# Patient Record
Sex: Male | Born: 1995 | Race: Black or African American | Hispanic: No | Marital: Single | Smoking: Never smoker
Health system: Southern US, Community
[De-identification: ages and names within clinical notes are randomized; demographics above are authoritative.]

## PROBLEM LIST (undated history)

## (undated) DIAGNOSIS — Z21 Asymptomatic human immunodeficiency virus [HIV] infection status: Secondary | ICD-10-CM

## (undated) DIAGNOSIS — B20 Human immunodeficiency virus [HIV] disease: Secondary | ICD-10-CM

---

## 2017-03-12 ENCOUNTER — Ambulatory Visit (HOSPITAL_COMMUNITY)
Admission: EM | Admit: 2017-03-12 | Discharge: 2017-03-12 | Discharge status: Against Medical Advice | Payer: Medicaid Other

## 2017-03-12 ENCOUNTER — Encounter (HOSPITAL_COMMUNITY): Payer: Self-pay | Admitting: Emergency Medicine

## 2017-03-12 DIAGNOSIS — R300 Dysuria: Secondary | ICD-10-CM | POA: Diagnosis not present

## 2017-03-12 HISTORY — DX: Asymptomatic human immunodeficiency virus (hiv) infection status: Z21

## 2017-03-12 HISTORY — DX: Human immunodeficiency virus (HIV) disease: B20

## 2017-03-12 LAB — POCT URINALYSIS DIP (DEVICE)
BILIRUBIN URINE: NEGATIVE
Glucose, UA: NEGATIVE mg/dL
KETONES UR: NEGATIVE mg/dL
LEUKOCYTES UA: NEGATIVE
Nitrite: NEGATIVE
PH: 6.5 (ref 5.0–8.0)
Protein, ur: NEGATIVE mg/dL
Specific Gravity, Urine: 1.015 (ref 1.005–1.030)
Urobilinogen, UA: 0.2 mg/dL (ref 0.0–1.0)

## 2017-03-12 NOTE — ED Notes (Signed)
Dirty and clean urine collected. 

## 2017-03-12 NOTE — ED Triage Notes (Addendum)
The patient presented to the [facility] with a complaint of being exposed to chlamydia. The patient reported a penile discharge and dysuria.

## 2017-03-12 NOTE — ED Notes (Signed)
PT left without being seen by provider

## 2017-10-05 ENCOUNTER — Encounter (HOSPITAL_COMMUNITY): Payer: Self-pay | Admitting: Nurse Practitioner

## 2017-10-05 DIAGNOSIS — M549 Dorsalgia, unspecified: Secondary | ICD-10-CM | POA: Diagnosis present

## 2017-10-05 DIAGNOSIS — Z5321 Procedure and treatment not carried out due to patient leaving prior to being seen by health care provider: Secondary | ICD-10-CM | POA: Insufficient documentation
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
## 2017-10-06 ENCOUNTER — Ambulatory Visit (HOSPITAL_COMMUNITY)
Admission: EM | Admit: 2017-10-06 | Discharge: 2017-10-06 | Disposition: A | Discharge status: Home / Self Care | Payer: Self-pay | Attending: Family Medicine | Admitting: Family Medicine

## 2017-10-06 ENCOUNTER — Encounter (HOSPITAL_COMMUNITY): Payer: Self-pay | Admitting: Family Medicine

## 2017-10-06 ENCOUNTER — Emergency Department (HOSPITAL_COMMUNITY)
Admission: EM | Admit: 2017-10-06 | Discharge: 2017-10-06 | Disposition: A | Discharge status: Home / Self Care | Payer: No Typology Code available for payment source | Attending: Emergency Medicine | Admitting: Emergency Medicine

## 2017-10-06 DIAGNOSIS — M542 Cervicalgia: Secondary | ICD-10-CM

## 2017-10-06 DIAGNOSIS — M7918 Myalgia, other site: Secondary | ICD-10-CM

## 2017-10-06 DIAGNOSIS — M549 Dorsalgia, unspecified: Secondary | ICD-10-CM

## 2017-10-06 MED ORDER — CYCLOBENZAPRINE HCL 10 MG PO TABS: 10.0000 mg | ORAL_TABLET | Freq: Two times a day (BID) | ORAL | 0 refills | Status: AC | PRN
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
## 2017-10-06 NOTE — Discharge Instructions (Signed)
Take flexeril 1 tablet twice a day for 10 days.  You may also take ibuprofen 600 mg every 8 hours.  You can use heating pads and ice for comfort.  Please return if symptoms worsen or have not improved in 1-2 weeks.

## 2017-10-06 NOTE — ED Notes (Signed)
Pt not in lobby. No answer when called/
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
## 2017-10-06 NOTE — ED Notes (Signed)
Pt not in lobby.  

## 2017-10-06 NOTE — ED Notes (Signed)
No answer for room. Not seen in lobby.

## 2018-08-21 ENCOUNTER — Encounter (HOSPITAL_COMMUNITY): Payer: Self-pay

## 2018-08-21 ENCOUNTER — Emergency Department (HOSPITAL_COMMUNITY)
Admission: EM | Admit: 2018-08-21 | Discharge: 2018-08-21 | Disposition: A | Discharge status: Home / Self Care | Payer: Self-pay | Attending: Emergency Medicine | Admitting: Emergency Medicine

## 2018-08-21 DIAGNOSIS — Z113 Encounter for screening for infections with a predominantly sexual mode of transmission: Secondary | ICD-10-CM | POA: Insufficient documentation

## 2018-08-21 DIAGNOSIS — R369 Urethral discharge, unspecified: Secondary | ICD-10-CM | POA: Insufficient documentation

## 2018-08-21 DIAGNOSIS — B20 Human immunodeficiency virus [HIV] disease: Secondary | ICD-10-CM | POA: Insufficient documentation

## 2018-08-21 MED ORDER — CEFTRIAXONE SODIUM 250 MG IJ SOLR
250.0000 mg | Freq: Once | INTRAMUSCULAR | Status: AC
Start: 2018-08-21 — End: 2018-08-21
  Administered 2018-08-21: 250 mg via INTRAMUSCULAR
  Filled 2018-08-21: qty 250

## 2018-08-21 MED ORDER — LEVOFLOXACIN 500 MG PO TABS: 500.0000 mg | ORAL_TABLET | Freq: Every day | ORAL | 0 refills | Status: AC

## 2018-08-21 MED ORDER — LEVOFLOXACIN 500 MG PO TABS
500.0000 mg | ORAL_TABLET | Freq: Once | ORAL | Status: AC
  Administered 2018-08-21: 500 mg via ORAL
  Filled 2018-08-21: qty 1

## 2018-08-21 MED ORDER — STERILE WATER FOR INJECTION IJ SOLN
INTRAMUSCULAR | Status: AC
  Administered 2018-08-21: 10 mL
  Filled 2018-08-21: qty 10

## 2018-08-21 NOTE — ED Triage Notes (Signed)
Pt states that he had a yellowish-white discharge from his penis that he noticed this am, he doesn't complain of any pain Pt last had sex on Sunday and everything was normal

## 2018-08-21 NOTE — Discharge Instructions (Addendum)
Please take antibiotic twice a day for the next 10 days until all pills are completely gone.  You will receive a phone call in the next 2 to 3 days if your results for chlamydia/gonorrhea are positive.  If positive you will need to let all sexual partners know so they can be treated as well.  I have listed information to the health department below.  Come back to the ER immediately if you have any new or concerning symptoms like fever, abdominal pain, vomiting.

## 2018-08-21 NOTE — ED Provider Notes (Signed)
[facility]EM[facility]ENCY [facility]PT Provider Note   CSN: [id number] Arrival date & time: 08/21/18  1958     History   Chief Complaint No chief complaint on file.   HPI Glen Howard is a 22 y.o. male.  HPI   Glen Howard is a 22 year old male who is HIV positive (on daily HAART) who presents to the emergency department for evaluation of penile discharge.  Patient states that he noticed white/yellow penile discharge this morning.  States that he is sexually active with 2 male partners and denies regular condom use.  He is unsure if any of his partners have been exposed to an STD.  He reports occasional bilateral testicular pain, none currently.  No scrotal swelling.  No painful bowel movements, fever, chills, abdominal pain, vomiting, dysuria, urinary frequency, hematuria, flank pain.  Past Medical History:  Diagnosis Date  . HIV (human immunodeficiency virus infection) (HCC)     There are no active problems to display for this patient.   History reviewed. No pertinent surgical history.      Home Medications    Prior to Admission medications   Medication Sig Start Date End Date Taking? Authorizing Provider  abacavir-dolutegravir-lamiVUDine (TRIUMEQ) 600-50-300 MG tablet Take 1 tablet by mouth daily.   Yes [provider]    Family History History reviewed. No pertinent family history.  Social History Social History   Tobacco Use  . Smoking status: Never Smoker  . Smokeless tobacco: Never Used  Substance Use Topics  . Alcohol use: No  . Drug use: Yes    Types: Marijuana     Allergies   Septra [sulfamethoxazole-trimethoprim]   Review of Systems Review of Systems  Constitutional: Negative for chills and fever.  Gastrointestinal: Negative for abdominal pain, diarrhea, nausea and vomiting.  Genitourinary: Positive for discharge and testicular pain. Negative for difficulty urinating, dysuria, genital sores, hematuria, penile pain and  scrotal swelling.  Skin: Negative for rash.     Physical Exam Updated Vital Signs BP 128/71 (BP Location: Right Arm)   Pulse 74   Temp 98.6 F (37 C) (Oral)   Resp 18   SpO2 100%   Physical Exam  Constitutional: He appears well-developed and well-nourished. No distress.  Nontoxic-appearing.  HENT:  Head: Normocephalic and atraumatic.  Eyes: Right eye exhibits no discharge. Left eye exhibits no discharge.  Pulmonary/Chest: Effort normal. No respiratory distress.  Abdominal: Soft. Bowel sounds are normal. There is no tenderness.  Genitourinary:  Genitourinary Comments: Chaperone present for exam. White discharge from penis. No erythema on the penis or testicles. He has mild pain over bilateral epididymis. No testicular masses or swelling. Cremaster reflex present bilaterally.    Neurological: He is alert. Coordination normal.  Skin: Skin is warm and dry. He is not diaphoretic.  Psychiatric: He has a normal mood and affect. His behavior is normal.  Nursing note and vitals reviewed.    ED Treatments / Results  Labs (all labs ordered are listed, but only abnormal results are displayed) Labs Reviewed  GC/CHLAMYDIA PROBE AMP ([place]) NOT AT [facility]    EKG None  Radiology No results found.  Procedures Procedures (including critical care time)  Medications Ordered in ED Medications  levofloxacin (LEVAQUIN) tablet 500 mg (has no administration in time range)  cefTRIAXone (ROCEPHIN) injection 250 mg (has no administration in time range)     Initial Impression / Assessment and Plan / ED Course  I have reviewed the triage vital signs and the nursing notes.  Pertinent  labs & imaging results that were available during my care of the patient were reviewed by me and considered in my medical decision making (see chart for details).     Presents with penile discharge.  Patient is afebrile without abdominal tenderness, abdominal pain or painful bowel movements to indicate  prostatitis.  He has mild tenderness over the bilateral epididymis, no testicular swelling or erythema. Will treat for potential developing epididymitis.No tenderness to palpation of the testes or epididymis. Per CDC recommendation treatment for acute appendicitis likely caused by sexually transmitted chlamydia/gonorrhea and enteric organisms (men who practice insertive anal sex) is IM ceftriaxone in addition to levofloxacin 500 mg once daily x10 days.  Patient received the ceftriaxone in the ED and received his first levofloxacin pill.  Patient understands that he has GC/chlamydia cultures which are pending and will receive a phone call if positive.  If positive, he understands that he will need to inform all sexual partners so that they can be treated as well.  Discussed importance of using protection when sexually active.  Final Clinical Impressions(s) / ED Diagnoses   Final diagnoses:  Penile discharge    ED Discharge Orders         Ordered    levofloxacin (LEVAQUIN) 500 MG tablet  Daily     08/21/18 2054           Kellie Shropshire, PA-C 08/21/18 2054    Terrilee Files, MD 08/22/18 [phone]

## 2018-08-22 LAB — GC/CHLAMYDIA PROBE AMP (~~LOC~~) NOT AT ARMC
Chlamydia: NEGATIVE
NEISSERIA GONORRHEA: POSITIVE — AB

## 2019-04-29 ENCOUNTER — Other Ambulatory Visit: Payer: Self-pay | Admitting: Family Medicine

## 2019-04-29 DIAGNOSIS — N50819 Testicular pain, unspecified: Secondary | ICD-10-CM

## 2019-05-13 ENCOUNTER — Other Ambulatory Visit: Payer: Medicaid Other

## 2019-06-11 ENCOUNTER — Ambulatory Visit
Admission: RE | Admit: 2019-06-11 | Discharge: 2019-06-11 | Disposition: A | Discharge status: Home / Self Care | Payer: BC Managed Care – PPO | Source: Ambulatory Visit | Attending: Family Medicine | Admitting: Family Medicine

## 2019-06-11 ENCOUNTER — Other Ambulatory Visit: Payer: Self-pay

## 2019-06-11 DIAGNOSIS — N50819 Testicular pain, unspecified: Secondary | ICD-10-CM

## 2020-03-04 ENCOUNTER — Ambulatory Visit: Payer: Medicaid Other | Attending: Internal Medicine

## 2020-03-04 DIAGNOSIS — Z23 Encounter for immunization: Secondary | ICD-10-CM

## 2020-03-04 NOTE — Progress Notes (Signed)
   Covid-19 Vaccination Clinic  Name:  Jaquaveon Bilal    MRN: [id number] DOB: [birth year]  03/04/2020  Mr. Ignasiak was observed post Covid-19 immunization for 15 minutes without incident. He was provided with Vaccine Information Sheet and instruction to access the V-Safe system.   Mr. Rothert was instructed to call 911 with any severe reactions post vaccine: Marland Kitchen Difficulty breathing  . Swelling of face and throat  . A fast heartbeat  . A bad rash all over body  . Dizziness and weakness   Immunizations Administered    Name Date Dose VIS Date Route   Pfizer COVID-19 Vaccine 03/04/2020  9:03 AM 0.3 mL 10/31/2019 Intramuscular   Manufacturer: [employer], [employer]   Lot: [id number]   NDC: [address]-8

## 2020-03-29 ENCOUNTER — Ambulatory Visit: Payer: Medicaid Other | Attending: Internal Medicine

## 2020-03-29 ENCOUNTER — Ambulatory Visit: Payer: Medicaid Other

## 2020-03-29 DIAGNOSIS — Z23 Encounter for immunization: Secondary | ICD-10-CM

## 2020-03-29 NOTE — Progress Notes (Signed)
   Covid-19 Vaccination Clinic  Name:  Glen Howard    MRN: [id number] DOB: 07-28-1996  03/29/2020  [name] was observed post Covid-19 immunization for 15 minutes without incident. He was provided with Vaccine Information Sheet and instruction to access the V-Safe system.   [name] was instructed to call 911 with any severe reactions post vaccine: Marland Kitchen Difficulty breathing  . Swelling of face and throat  . A fast heartbeat  . A bad rash all over body  . Dizziness and weakness   Immunizations Administered    Name Date Dose VIS Date Route   Pfizer COVID-19 Vaccine 03/29/2020 12:00 PM 0.3 mL 01/14/2019 Intramuscular   Manufacturer: [employer], [employer]   Lot: [id number]   NDC: [address]-0

## 2020-11-27 IMAGING — US ULTRASOUND SCROTUM DOPPLER COMPLETE
1 series · 14 of 25 positions shown · non-contrast
Comparison: None.

CLINICAL DATA: Right testicular pain for 6 months

EXAM:
SCROTAL ULTRASOUND
DOPPLER ULTRASOUND OF THE TESTICLES
TECHNIQUE: Complete ultrasound examination of the testicles, epididymis, and
other scrotal structures was performed. Color and spectral Doppler
ultrasound were also utilized to evaluate blood flow to the
testicles.

[Series 1: ultrasound scrotum doppler complete · 0.06mm/px · 14 of 51 slices shown]
[im 1/51]
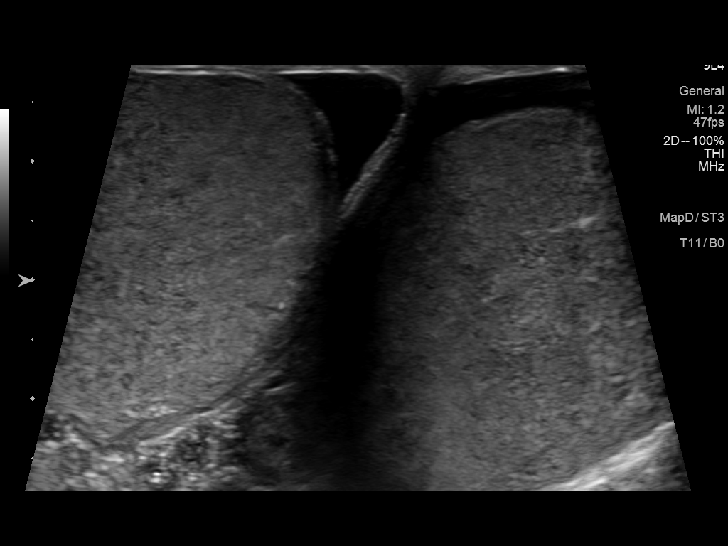
[im 5/51]
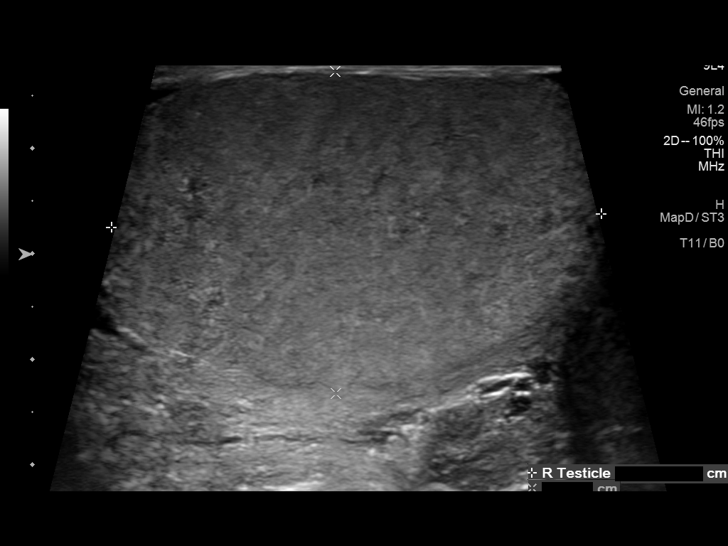
[im 9/51]
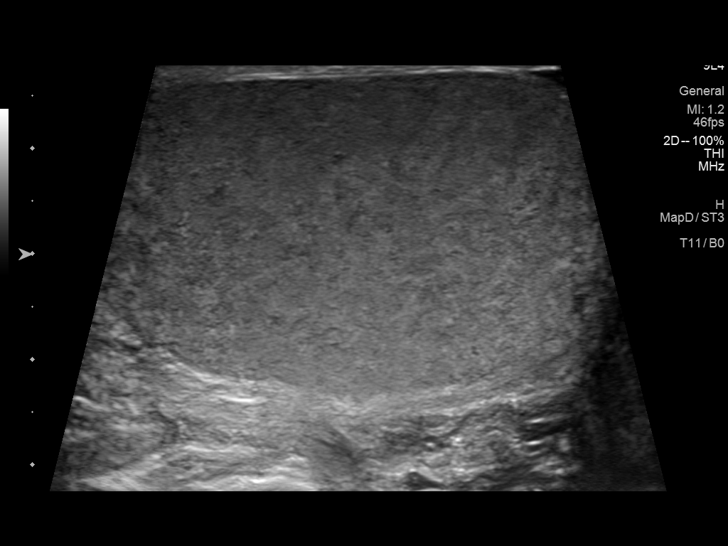
[im 13/51]
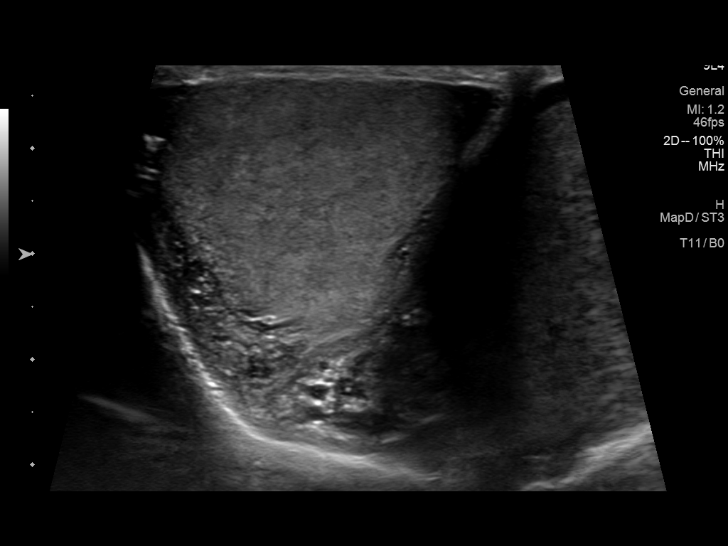
[im 17/51]
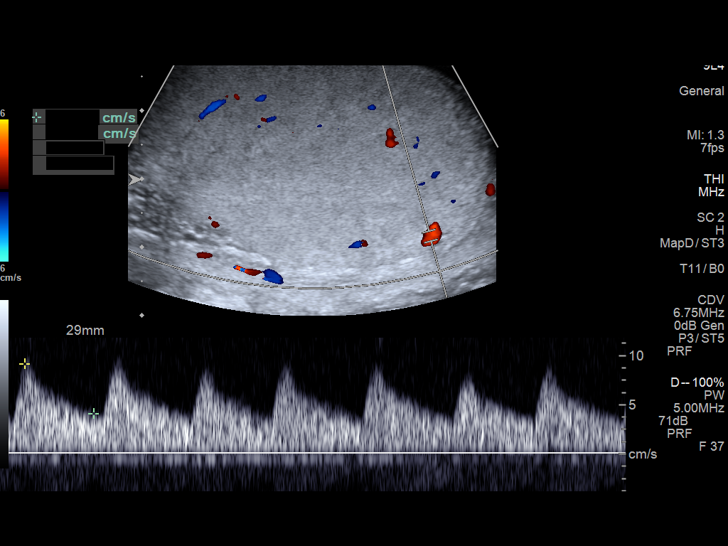
[im 19/51]
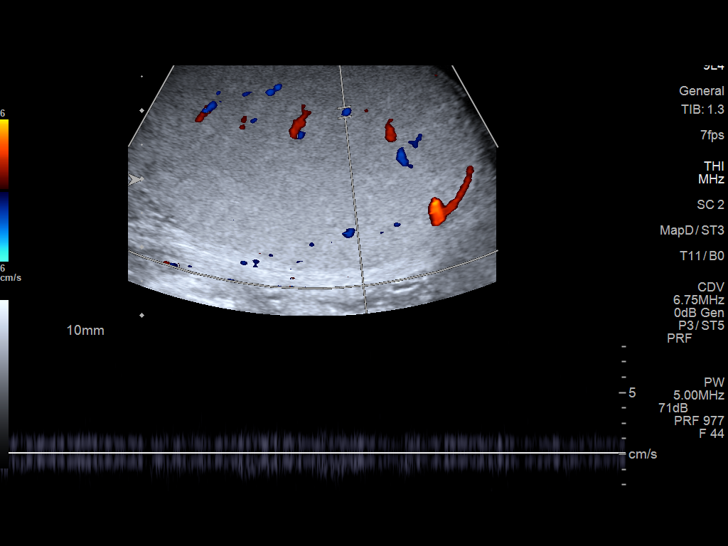
[im 23/51]
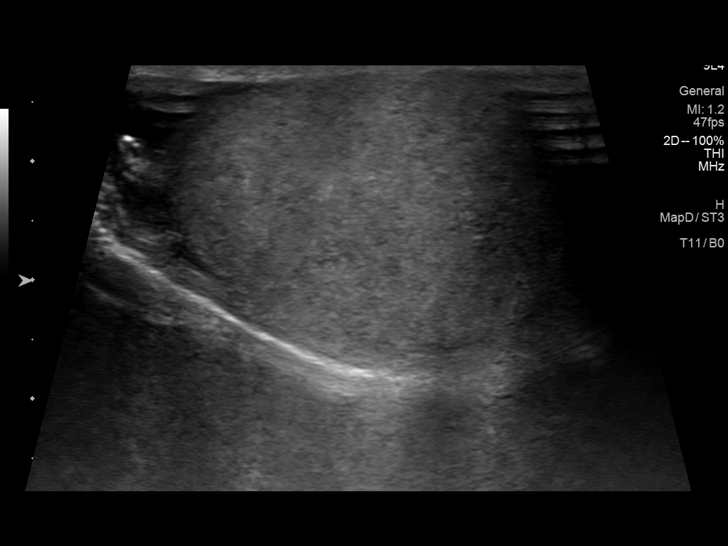
[im 28/51]
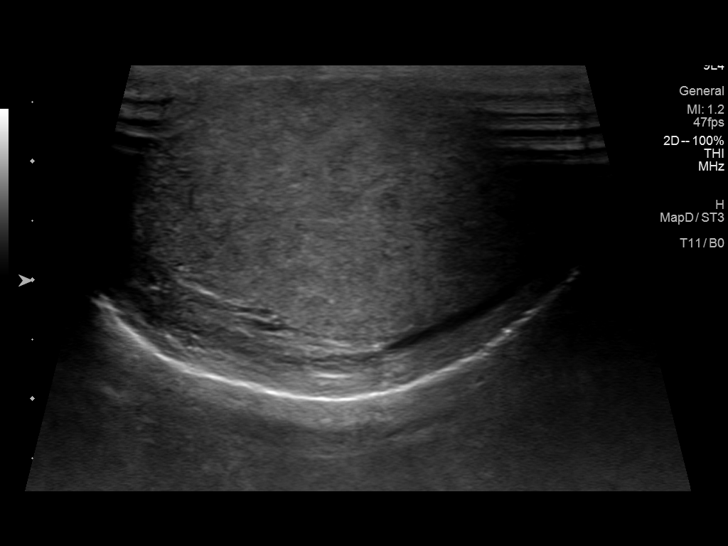
[im 32/51]
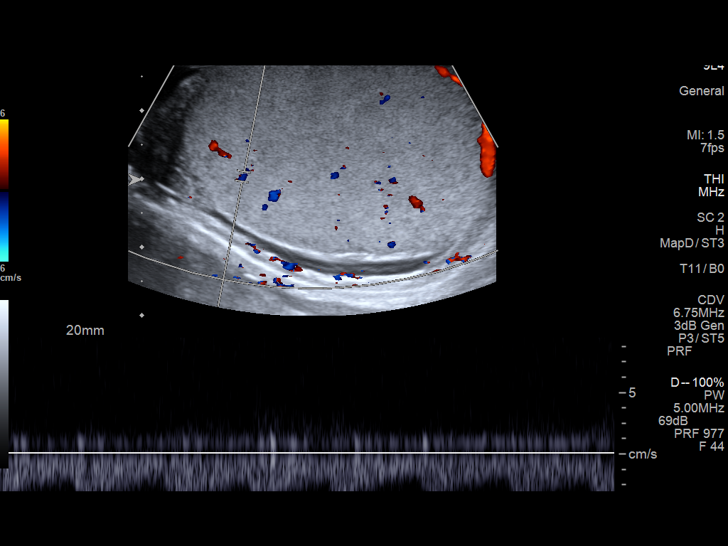
[im 34/51]
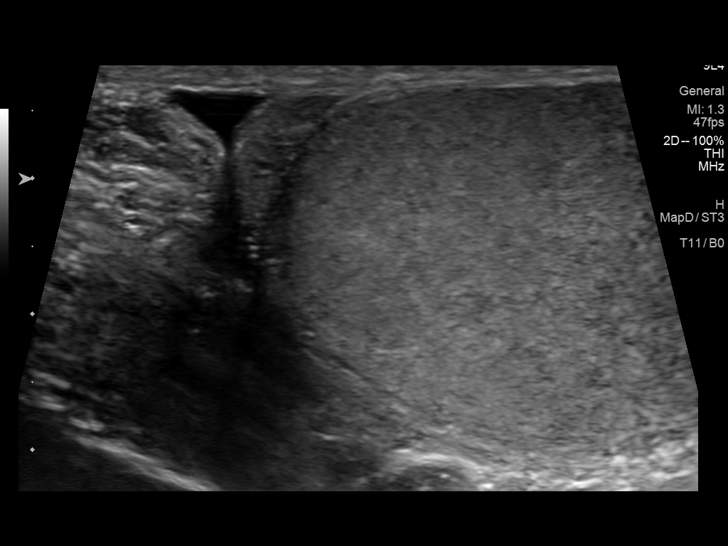
[im 38/51]
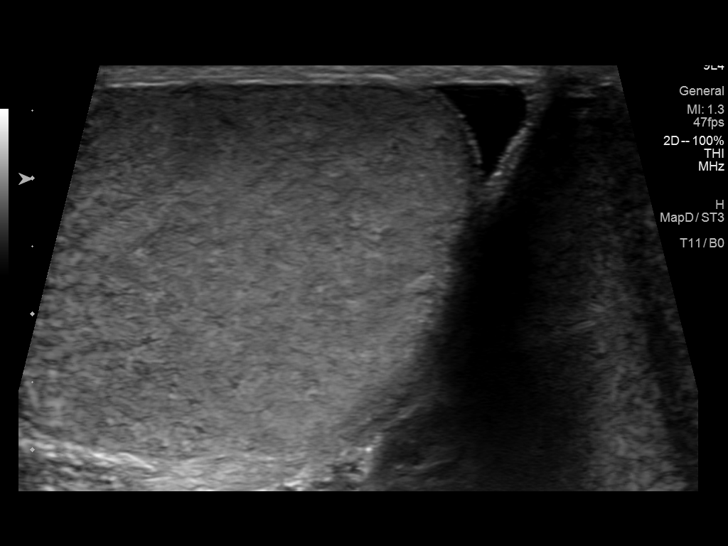
[im 42/51]
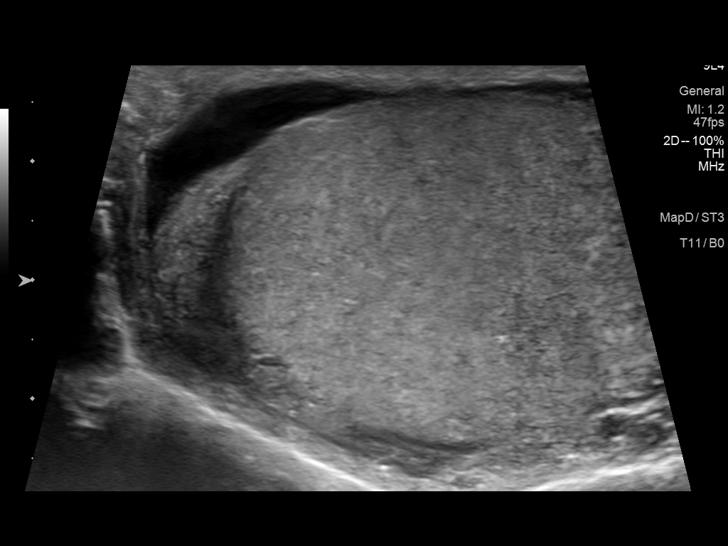
[im 46/51]
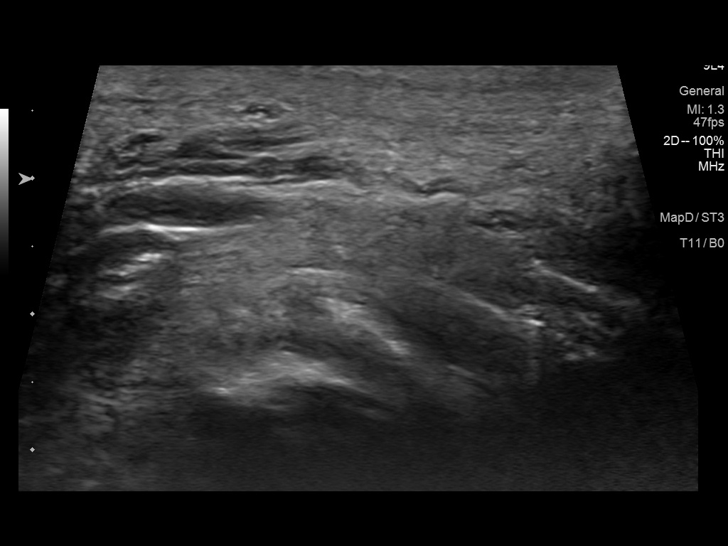
[im 51/51]
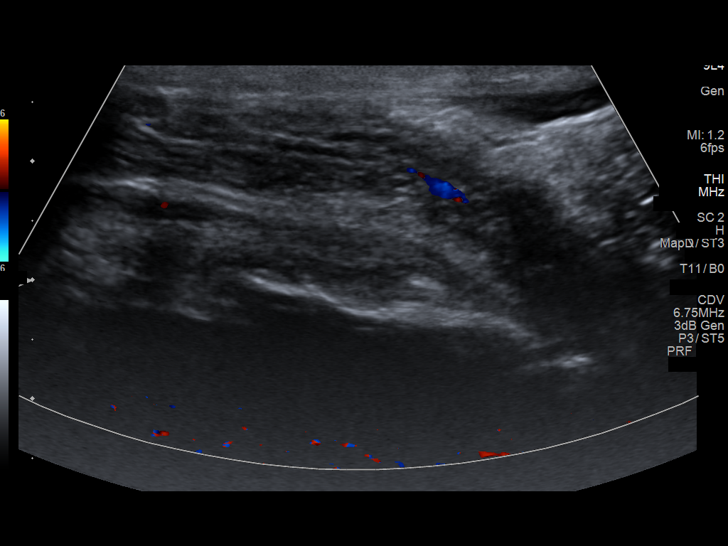

[14 of 25 positions shown; findings below may reference images not displayed]

FINDINGS: Right testicle

Measurements: 4.6 x 3.1 x 4.1 cm. No mass or microlithiasis
visualized.

Left testicle

Measurements: 4.7 x 2.8 x 3.2 cm. No mass or microlithiasis
visualized.

Right epididymis:  Normal in size and appearance.

Left epididymis:  Normal in size and appearance.

Hydrocele:  None visualized.

Varicocele:  None visualized.

Pulsed Doppler interrogation of both testes demonstrates normal low
resistance arterial and venous waveforms bilaterally.
IMPRESSION: No cause for pain identified.  No abnormalities noted.

## 2021-11-18 ENCOUNTER — Other Ambulatory Visit (HOSPITAL_COMMUNITY): Payer: Self-pay

## 2021-11-18 MED ORDER — TRIUMEQ 600-50-300 MG PO TABS
ORAL_TABLET | ORAL | 0 refills | Status: DC
  Filled 2021-11-18: qty 30, 30d supply, fill #0

## 2021-11-23 ENCOUNTER — Other Ambulatory Visit: Payer: Self-pay

## 2021-11-23 ENCOUNTER — Telehealth: Payer: Self-pay | Admitting: Pharmacist

## 2021-11-23 ENCOUNTER — Other Ambulatory Visit (HOSPITAL_COMMUNITY): Payer: Self-pay

## 2021-11-23 ENCOUNTER — Ambulatory Visit: Payer: 59 | Admitting: Pharmacist

## 2021-11-23 DIAGNOSIS — B20 Human immunodeficiency virus [HIV] disease: Secondary | ICD-10-CM

## 2021-11-23 MED ORDER — TRIUMEQ 600-50-300 MG PO TABS
ORAL_TABLET | ORAL | 0 refills | Status: DC
  Filled 2021-11-23: qty 30, 30d supply, fill #0

## 2021-11-23 NOTE — Telephone Encounter (Signed)
Called patient to discuss HIV medication, Triumeq for [employer]. No answer, left HIPAA compliant VM.  Neenah Canter L. Shonette Rhames, PharmD [facility] Clinical Pharmacist Practitioner

## 2021-11-23 NOTE — Progress Notes (Signed)
Virtual Visit via Telephone Note  I connected with Glen Howard on 11/23/21 at  2:30 PM EST by telephone and verified that I am speaking with the correct person using two identifiers.  Location: Patient: Home Provider: Office   I discussed the limitations, risks, security and privacy concerns of performing an evaluation and management service by telephone and the availability of in person appointments. I also discussed with the patient that there may be a patient responsible charge related to this service. The patient expressed understanding and agreed to proceed.  HPI: Glen Howard is a 26 y.o. male who presents for follow-up of their long-term specialty medication, Triumeq.  There are no problems to display for this patient.   Patient's Medications  New Prescriptions   No medications on file  Previous Medications   No medications on file  Modified Medications   Modified Medication Previous Medication   ABACAVIR-DOLUTEGRAVIR-LAMIVUDINE (TRIUMEQ) 600-50-300 MG TABLET abacavir-dolutegravir-lamiVUDine (TRIUMEQ) 600-50-300 MG tablet      Take 1 tablet by mouth daily.    Take 1 tablet by mouth daily.  Discontinued Medications   ABACAVIR-DOLUTEGRAVIR-LAMIVUDINE (TRIUMEQ) 600-50-300 MG TABLET    Take 1 tablet by mouth daily.    Allergies: Allergies  Allergen Reactions   Septra [Sulfamethoxazole-Trimethoprim] Rash    Past Medical History: Past Medical History:  Diagnosis Date   HIV (human immunodeficiency virus infection) (HCC)     Social History: Social History   Socioeconomic History   Marital status: Single    Spouse name: Not on file   Number of children: Not on file   Years of education: Not on file   Highest education level: Not on file  Occupational History   Not on file  Tobacco Use   Smoking status: Never   Smokeless tobacco: Never  Substance and Sexual Activity   Alcohol use: No   Drug use: Yes    Types: Marijuana   Sexual activity: Not on file   Other Topics Concern   Not on file  Social History Narrative   Not on file   Social Determinants of Health   Financial Resource Strain: Not on file  Food Insecurity: Not on file  Transportation Needs: Not on file  Physical Activity: Not on file  Stress: Not on file  Social Connections: Not on file    Labs: No results found for: HIV1RNAQUANT, HIV1RNAVL, CD4TABS  RPR and STI No results found for: LABRPR, RPRTITER  STI Results GC CT  08/21/2018 **POSITIVE**(A) Negative    Hepatitis B No results found for: HEPBSAB, HEPBSAG, HEPBCAB Hepatitis C No results found for: HEPCAB, HCVRNAPCRQN Hepatitis A No results found for: HAV Lipids: No results found for: CHOL, TRIG, HDL, CHOLHDL, VLDL, LDLCALC  Assessment: I spoke with Glen Howard today regarding their specialty medication, Triumeq. Patient takes it every day without any issues or missed doses. No problems with adverse effects or tolerability. No problems getting it from [facility] and will pick up his refill today. Updated/reviewed medication list - no drug interactions. All questions answered. Will follow up in 1 year.  Plan: - Continue Triumeq - Follow up in 1 year   I discussed the assessment and treatment plan with the patient. The patient was provided an opportunity to ask questions and all were answered. The patient agreed with the plan and demonstrated an understanding of the instructions.   The patient was advised to call back or seek an in-person evaluation if the symptoms worsen or if the condition fails to improve as  anticipated.  I provided 5 minutes of non-face-to-face time during this encounter.  Glen Howard L. Glen Howard, PharmD, BCIDP, AAHIVP, CPP Clinical Pharmacist Practitioner Infectious Diseases Clinical Pharmacist [facility] 11/23/2021, 2:47 PM

## 2021-12-08 ENCOUNTER — Other Ambulatory Visit (HOSPITAL_COMMUNITY): Payer: Self-pay

## 2021-12-08 MED ORDER — IBUPROFEN 800 MG PO TABS
800.0000 mg | ORAL_TABLET | Freq: Four times a day (QID) | ORAL | 0 refills | Status: DC | PRN
  Filled 2021-12-08: qty 15, 4d supply, fill #0

## 2021-12-08 MED ORDER — AMOXICILLIN 500 MG PO CAPS
500.0000 mg | ORAL_CAPSULE | Freq: Three times a day (TID) | ORAL | 0 refills | Status: DC
  Filled 2021-12-08: qty 25, 9d supply, fill #0

## 2021-12-20 ENCOUNTER — Other Ambulatory Visit (HOSPITAL_COMMUNITY): Payer: Self-pay

## 2021-12-22 ENCOUNTER — Other Ambulatory Visit (HOSPITAL_COMMUNITY): Payer: Self-pay

## 2021-12-22 ENCOUNTER — Other Ambulatory Visit: Payer: Self-pay | Admitting: Pharmacist

## 2021-12-22 DIAGNOSIS — B20 Human immunodeficiency virus [HIV] disease: Secondary | ICD-10-CM

## 2021-12-22 MED ORDER — TRIUMEQ 600-50-300 MG PO TABS
ORAL_TABLET | ORAL | 5 refills | Status: DC
  Filled 2021-12-22: qty 30, 30d supply, fill #0

## 2021-12-23 ENCOUNTER — Other Ambulatory Visit (HOSPITAL_COMMUNITY): Payer: Self-pay

## 2021-12-26 ENCOUNTER — Other Ambulatory Visit (HOSPITAL_COMMUNITY): Payer: Self-pay

## 2021-12-26 ENCOUNTER — Ambulatory Visit (INDEPENDENT_AMBULATORY_CARE_PROVIDER_SITE_OTHER): Payer: 59 | Admitting: Pharmacist

## 2021-12-26 ENCOUNTER — Other Ambulatory Visit: Payer: Self-pay

## 2021-12-26 DIAGNOSIS — B20 Human immunodeficiency virus [HIV] disease: Secondary | ICD-10-CM

## 2021-12-26 MED ORDER — CABOTEGRAVIR & RILPIVIRINE ER 600 & 900 MG/3ML IM SUER
1.0000 | INTRAMUSCULAR | 5 refills | Status: AC
  Filled 2021-12-26: qty 6, 30d supply, fill #0
  Filled 2021-12-26: qty 6, fill #0
  Filled 2022-01-18: qty 6, 30d supply, fill #1
  Filled 2022-03-15: qty 6, 30d supply, fill #2
  Filled 2022-05-16: qty 6, 30d supply, fill #3
  Filled 2022-07-19: qty 6, 30d supply, fill #4
  Filled 2022-09-19: qty 6, 30d supply, fill #5

## 2021-12-26 MED ORDER — CABOTEGRAVIR & RILPIVIRINE ER 600 & 900 MG/3ML IM SUER
1.0000 | INTRAMUSCULAR | 5 refills | Status: DC
  Filled 2021-12-26: qty 6, 28d supply, fill #0

## 2021-12-26 NOTE — Progress Notes (Signed)
Virtual Visit via Telephone Note  I connected with Glen Howard on 12/26/21 at 11:00 AM by telephone and verified that I am speaking with the correct person using two identifiers.  Location: Patient: Home Provider: Office   I discussed the limitations, risks, security and privacy concerns of performing an evaluation and management service by telephone and the availability of in person appointments. I also discussed with the patient that there may be a patient responsible charge related to this service. The patient expressed understanding and agreed to proceed.  HPI: Glen Howard is a 26 y.o. male who presents for follow-up of their long-term specialty medication, Cabenuva.  There are no problems to display for this patient.   Patient's Medications  New Prescriptions   No medications on file  Previous Medications   ABACAVIR-DOLUTEGRAVIR-LAMIVUDINE (TRIUMEQ) 600-50-300 MG TABLET    Take 1 tablet by mouth daily.   AMOXICILLIN (AMOXIL) 500 MG CAPSULE    Take 1 capsule (500 mg total) by mouth 3 (three) times daily with food until gone.   IBUPROFEN (ADVIL) 800 MG TABLET    Take 1 tablet (800 mg total) by mouth every 6 (six) hours as needed for pain  Modified Medications   Modified Medication Previous Medication   CABOTEGRAVIR & RILPIVIRINE ER (CABENUVA) 600 & 900 MG/3ML INJECTION cabotegravir & rilpivirine ER (CABENUVA) 600 & 900 MG/3ML injection      Inject 66ml into the muscle once monthly for 2 months then every other month thereafter    Inject 57ml into the muscle once monthly for 2 months then every other month thereafter  Discontinued Medications   No medications on file    Allergies: Allergies  Allergen Reactions   Septra [Sulfamethoxazole-Trimethoprim] Rash    Past Medical History: Past Medical History:  Diagnosis Date   HIV (human immunodeficiency virus infection) (HCC)     Social History: Social History   Socioeconomic History   Marital status: Single    Spouse  name: Not on file   Number of children: Not on file   Years of education: Not on file   Highest education level: Not on file  Occupational History   Not on file  Tobacco Use   Smoking status: Never   Smokeless tobacco: Never  Substance and Sexual Activity   Alcohol use: No   Drug use: Yes    Types: Marijuana   Sexual activity: Not on file  Other Topics Concern   Not on file  Social History Narrative   Not on file   Social Determinants of Health   Financial Resource Strain: Not on file  Food Insecurity: Not on file  Transportation Needs: Not on file  Physical Activity: Not on file  Stress: Not on file  Social Connections: Not on file    Labs: No results found for: HIV1RNAQUANT, HIV1RNAVL, CD4TABS  RPR and STI No results found for: LABRPR, RPRTITER  STI Results GC CT  08/21/2018 **POSITIVE**(A) Negative    Hepatitis B No results found for: HEPBSAB, HEPBSAG, HEPBCAB Hepatitis C No results found for: HEPCAB, HCVRNAPCRQN Hepatitis A No results found for: HAV Lipids: No results found for: CHOL, TRIG, HDL, CHOLHDL, VLDL, LDLCALC  Assessment: I spoke with [name] today regarding their new specialty medication, Cabenuva. Currently taking Triumeq without any issues. Counseled that he will need to take his last Triumeq pill the day of his first Cabenuva injection. Counseled on possible side effects of pain and nodule with injection and advised that he can take ibuprofen or tylenol 30-45 minutes  before injections. Updated/reviewed medication list - no drug interactions. All questions answered. Will follow up in 1 year.  Plan: - Take Triumeq until first dose of Cabenuva - Follow up in 1 year   I discussed the assessment and treatment plan with the patient. The patient was provided an opportunity to ask questions and all were answered. The patient agreed with the plan and demonstrated an understanding of the instructions.   The patient was advised to call back or seek an  in-person evaluation if the symptoms worsen or if the condition fails to improve as anticipated.  I provided 15 minutes of non-face-to-face time during this encounter.  Manolito Jurewicz L. Kerington Hildebrant, PharmD, BCIDP, AAHIVP, CPP Clinical Pharmacist Practitioner Infectious Diseases Clinical Pharmacist [facility] 12/26/2021, 12:25 PM

## 2021-12-27 ENCOUNTER — Other Ambulatory Visit (HOSPITAL_COMMUNITY): Payer: Self-pay

## 2021-12-28 ENCOUNTER — Other Ambulatory Visit (HOSPITAL_COMMUNITY): Payer: Self-pay

## 2021-12-29 ENCOUNTER — Other Ambulatory Visit (HOSPITAL_COMMUNITY): Payer: Self-pay

## 2021-12-29 MED ORDER — ACETAMINOPHEN 500 MG PO TABS
500.0000 mg | ORAL_TABLET | Freq: Four times a day (QID) | ORAL | 0 refills | Status: DC | PRN
  Filled 2021-12-29: qty 15, 4d supply, fill #0

## 2021-12-29 MED ORDER — IBUPROFEN 600 MG PO TABS
600.0000 mg | ORAL_TABLET | Freq: Four times a day (QID) | ORAL | 0 refills | Status: AC | PRN
Start: 2021-12-29 — End: ?
  Filled 2021-12-29: qty 15, 4d supply, fill #0

## 2021-12-29 MED ORDER — CHLORHEXIDINE GLUCONATE 0.12 % MT SOLN
15.0000 mL | Freq: Every day | OROMUCOSAL | 0 refills | Status: AC
Start: 2021-12-29 — End: ?
  Filled 2021-12-29: qty 473, 30d supply, fill #0

## 2022-01-04 ENCOUNTER — Other Ambulatory Visit (HOSPITAL_COMMUNITY): Payer: Self-pay

## 2022-01-04 MED ORDER — CABENUVA 600 & 900 MG/3ML IM SUER: INTRAMUSCULAR | 5 refills | Status: AC

## 2022-01-04 MED ORDER — CABENUVA 600 & 900 MG/3ML IM SUER
INTRAMUSCULAR | 5 refills | Status: AC
  Filled 2022-11-16: qty 6, 56d supply, fill #0
  Filled 2022-12-05: qty 6, 56d supply, fill #1

## 2022-01-17 ENCOUNTER — Other Ambulatory Visit (HOSPITAL_COMMUNITY): Payer: Self-pay

## 2022-01-18 ENCOUNTER — Other Ambulatory Visit (HOSPITAL_COMMUNITY): Payer: Self-pay

## 2022-01-24 ENCOUNTER — Other Ambulatory Visit (HOSPITAL_COMMUNITY): Payer: Self-pay

## 2022-02-22 ENCOUNTER — Other Ambulatory Visit (HOSPITAL_COMMUNITY): Payer: Self-pay

## 2022-02-23 ENCOUNTER — Other Ambulatory Visit (HOSPITAL_COMMUNITY): Payer: Self-pay

## 2022-03-15 ENCOUNTER — Other Ambulatory Visit (HOSPITAL_COMMUNITY): Payer: Self-pay

## 2022-03-28 ENCOUNTER — Other Ambulatory Visit (HOSPITAL_COMMUNITY): Payer: Self-pay

## 2022-05-16 ENCOUNTER — Other Ambulatory Visit (HOSPITAL_COMMUNITY): Payer: Self-pay

## 2022-05-29 ENCOUNTER — Other Ambulatory Visit (HOSPITAL_COMMUNITY): Payer: Self-pay

## 2022-06-02 ENCOUNTER — Other Ambulatory Visit (HOSPITAL_COMMUNITY): Payer: Self-pay

## 2022-06-22 ENCOUNTER — Other Ambulatory Visit (HOSPITAL_COMMUNITY): Payer: Self-pay

## 2022-07-19 ENCOUNTER — Other Ambulatory Visit (HOSPITAL_COMMUNITY): Payer: Self-pay

## 2022-07-25 ENCOUNTER — Other Ambulatory Visit (HOSPITAL_COMMUNITY): Payer: Self-pay

## 2022-09-15 ENCOUNTER — Other Ambulatory Visit (HOSPITAL_COMMUNITY): Payer: Self-pay

## 2022-09-18 ENCOUNTER — Other Ambulatory Visit (HOSPITAL_COMMUNITY): Payer: Self-pay

## 2022-09-19 ENCOUNTER — Other Ambulatory Visit (HOSPITAL_COMMUNITY): Payer: Self-pay

## 2022-09-27 ENCOUNTER — Other Ambulatory Visit (HOSPITAL_COMMUNITY): Payer: Self-pay

## 2022-09-28 ENCOUNTER — Other Ambulatory Visit (HOSPITAL_COMMUNITY): Payer: Self-pay

## 2022-11-16 ENCOUNTER — Other Ambulatory Visit: Payer: Self-pay

## 2022-11-28 ENCOUNTER — Other Ambulatory Visit (HOSPITAL_COMMUNITY): Payer: Self-pay

## 2022-11-28 ENCOUNTER — Other Ambulatory Visit: Payer: Self-pay

## 2022-11-29 ENCOUNTER — Other Ambulatory Visit (HOSPITAL_COMMUNITY): Payer: Self-pay

## 2022-11-30 ENCOUNTER — Other Ambulatory Visit (HOSPITAL_COMMUNITY): Payer: Self-pay

## 2022-11-30 ENCOUNTER — Other Ambulatory Visit: Payer: Self-pay

## 2022-12-05 ENCOUNTER — Other Ambulatory Visit: Payer: Self-pay

## 2022-12-05 ENCOUNTER — Other Ambulatory Visit (HOSPITAL_COMMUNITY): Payer: Self-pay

## 2022-12-06 ENCOUNTER — Other Ambulatory Visit (HOSPITAL_COMMUNITY): Payer: Self-pay

## 2022-12-11 ENCOUNTER — Other Ambulatory Visit (HOSPITAL_COMMUNITY): Payer: Self-pay

## 2022-12-12 ENCOUNTER — Other Ambulatory Visit (HOSPITAL_COMMUNITY): Payer: Self-pay

## 2022-12-29 ENCOUNTER — Other Ambulatory Visit (HOSPITAL_COMMUNITY): Payer: Self-pay

## 2022-12-29 ENCOUNTER — Other Ambulatory Visit: Payer: Self-pay

## 2023-01-10 ENCOUNTER — Other Ambulatory Visit (HOSPITAL_COMMUNITY): Payer: Self-pay

## 2023-01-26 ENCOUNTER — Telehealth: Payer: Self-pay | Admitting: Pharmacist

## 2023-01-26 ENCOUNTER — Ambulatory Visit (INDEPENDENT_AMBULATORY_CARE_PROVIDER_SITE_OTHER): Payer: Commercial Managed Care - PPO | Admitting: Pharmacist

## 2023-01-26 ENCOUNTER — Other Ambulatory Visit: Payer: Self-pay

## 2023-01-26 DIAGNOSIS — B20 Human immunodeficiency virus [HIV] disease: Secondary | ICD-10-CM

## 2023-01-26 NOTE — Progress Notes (Signed)
Virtual Visit via Telephone Note  I connected with  Glen Howard on 01/26/23 at 11:15 AM EST by telephone and verified that I am speaking with the correct person using two identifiers.  Location: Patient: Home Provider: Office   I discussed the limitations, risks, security and privacy concerns of performing an evaluation and management service by telephone and the availability of in person appointments. I also discussed with the patient that there may be a patient responsible charge related to this service. The patient expressed understanding and agreed to proceed.  Date:  01/26/2023   HPI: Glen Howard is a 27 y.o. male who presents for follow-up of their specialty HIV medication, Cabenuva.  There are no problems to display for this patient.   Patient's Medications  New Prescriptions   No medications on file  Previous Medications   ABACAVIR-DOLUTEGRAVIR-LAMIVUDINE (TRIUMEQ) 600-50-300 MG TABLET    Take 1 tablet by mouth daily.   ACETAMINOPHEN (TYLENOL) 500 MG TABLET    Take 1 tablet (500 mg total) by mouth every 6 (six) hours as needed for pain   AMOXICILLIN (AMOXIL) 500 MG CAPSULE    Take 1 capsule (500 mg total) by mouth 3 (three) times daily with food until gone.   CABOTEGRAVIR & RILPIVIRINE ER (CABENUVA) 600 & 900 MG/3ML INJECTION    Inject 83m into the muscle once monthly for 2 months then every other month thereafter   CABOTEGRAVIR & RILPIVIRINE ER (CABENUVA) 600 & 900 MG/3ML INJECTION    Inject 628m IM Q month x 2 months and injection 60m23mIM Q2 months thereafter   CABOTEGRAVIR & RILPIVIRINE ER (CABENUVA) 600 & 900 MG/3ML INJECTION    Inject 60ml29mM Q month x 2 months and injection 60mls360m Q2 months thereafter   CHLORHEXIDINE (PERIDEX) 0.12 % SOLUTION    Floss and brush teeth. Completely rinse toothpaste from mouth. swish 15 mL (one capful) for 30 seconds, then expectorate   IBUPROFEN (ADVIL) 600 MG TABLET    Take 1 tablet (600 mg total) by mouth every 6 (six) hours as needed  for pain   IBUPROFEN (ADVIL) 800 MG TABLET    Take 1 tablet (800 mg total) by mouth every 6 (six) hours as needed for pain  Modified Medications   No medications on file  Discontinued Medications   No medications on file    Allergies: Allergies  Allergen Reactions   Septra [Sulfamethoxazole-Trimethoprim] Rash    Past Medical History: Past Medical History:  Diagnosis Date   HIV (human immunodeficiency virus infection) (HCC) [place] Social History: Social History   Socioeconomic History   Marital status: Single    Spouse name: Not on file   Number of children: Not on file   Years of education: Not on file   Highest education level: Not on file  Occupational History   Not on file  Tobacco Use   Smoking status: Never   Smokeless tobacco: Never  Substance and Sexual Activity   Alcohol use: No   Drug use: Yes    Types: Marijuana   Sexual activity: Not on file  Other Topics Concern   Not on file  Social History Narrative   Not on file   Social Determinants of Health   Financial Resource Strain: Not on file  Food Insecurity: Not on file  Transportation Needs: Not on file  Physical Activity: Not on file  Stress: Not on file  Social Connections: Not on file        No data  to display          Labs:  SCr: No results found for: "CREATININE" HIV No results found for: "HIV" Hepatitis B No results found for: "HEPBSAB", "HEPBSAG", "HEPBCAB" Hepatitis C No results found for: "HEPCAB", "HCVRNAPCRQN" Hepatitis A No results found for: "HAV" RPR and STI No results found for: "LABRPR", "RPRTITER"  STI Results GC CT  08/21/2018 12:00 AM **POSITIVE**  Negative     Assessment: I spoke with Glen Howard today regarding their specialty medication, Cabenuva for HIV treatment. Patient has received all injections on time. No problems with adverse effects or tolerability. Clinic has no problems getting it from [place]. Updated/reviewed medication list -  no drug interactions. All questions answered. Will follow up in 1 year.  Plan: - Continue Cabenuva injections  - Follow up in 1 year  I discussed the assessment and treatment plan with the patient. The patient was provided an opportunity to ask questions and all were answered. The patient agreed with the plan and demonstrated an understanding of the instructions.   The patient was advised to call back or seek an in-person evaluation if the symptoms worsen or if the condition fails to improve as anticipated.  I provided 15 minutes of non-face-to-face time during this encounter.  Alfonse Spruce, PharmD, CPP, BCIDP, [place] Clinical Pharmacist Practitioner [facility] [place] [facility] Infect[facility]

## 2023-01-26 NOTE — Telephone Encounter (Signed)
Called patient to discuss HIV medication, Kern Reap, for [facility] [employer] [facility]. No answer, left HIPAA compliant VM.  Alfonse Spruce, PharmD, CPP, BCIDP, [place] Clinical Pharmacist Practitioner Infectious [place] [facility]

## 2023-02-16 ENCOUNTER — Other Ambulatory Visit (HOSPITAL_COMMUNITY): Payer: Self-pay

## 2023-12-04 DIAGNOSIS — Z113 Encounter for screening for infections with a predominantly sexual mode of transmission: Secondary | ICD-10-CM | POA: Diagnosis not present

## 2023-12-04 DIAGNOSIS — B20 Human immunodeficiency virus [HIV] disease: Secondary | ICD-10-CM | POA: Diagnosis not present

## 2024-01-31 ENCOUNTER — Telehealth: Payer: Self-pay | Admitting: Pharmacist

## 2024-01-31 NOTE — Telephone Encounter (Signed)
 Called patient to discuss HIV medication, Glen Howard, for [facility] [employer] [facility]. No answer, unable to LVM.  Margarite Gouge, PharmD, CPP, BCIDP, AAHIVP Clinical Pharmacist Practitioner Infectious Diseases Clinical Pharmacist [facility] for Infectious Disease

## 2024-03-13 NOTE — Telephone Encounter (Signed)
 Attempted call again without success - unable to LVM.  Nicklas Barns, PharmD, CPP, BCIDP, AAHIVP Clinical Pharmacist Practitioner Infectious Diseases Clinical Pharmacist [facility] for Infectious Disease

## 2024-07-16 ENCOUNTER — Emergency Department (HOSPITAL_BASED_OUTPATIENT_CLINIC_OR_DEPARTMENT_OTHER): Payer: Self-pay

## 2024-07-16 ENCOUNTER — Emergency Department (HOSPITAL_BASED_OUTPATIENT_CLINIC_OR_DEPARTMENT_OTHER)
Admission: EM | Admit: 2024-07-16 | Discharge: 2024-07-16 | Disposition: A | Discharge status: Home / Self Care | Payer: Self-pay | Attending: Emergency Medicine | Admitting: Emergency Medicine

## 2024-07-16 ENCOUNTER — Other Ambulatory Visit: Payer: Self-pay

## 2024-07-16 ENCOUNTER — Encounter (HOSPITAL_BASED_OUTPATIENT_CLINIC_OR_DEPARTMENT_OTHER): Payer: Self-pay

## 2024-07-16 DIAGNOSIS — R0789 Other chest pain: Secondary | ICD-10-CM | POA: Insufficient documentation

## 2024-07-16 DIAGNOSIS — R079 Chest pain, unspecified: Secondary | ICD-10-CM

## 2024-07-16 DIAGNOSIS — J029 Acute pharyngitis, unspecified: Secondary | ICD-10-CM | POA: Diagnosis present

## 2024-07-16 DIAGNOSIS — J020 Streptococcal pharyngitis: Secondary | ICD-10-CM | POA: Diagnosis not present

## 2024-07-16 DIAGNOSIS — Z21 Asymptomatic human immunodeficiency virus [HIV] infection status: Secondary | ICD-10-CM | POA: Diagnosis not present

## 2024-07-16 LAB — BASIC METABOLIC PANEL WITH GFR
Anion gap: 13 (ref 5–15)
BUN: 14 mg/dL (ref 6–20)
CO2: 23 mmol/L (ref 22–32)
Calcium: 9.5 mg/dL (ref 8.9–10.3)
Chloride: 103 mmol/L (ref 98–111)
Creatinine, Ser: 1.09 mg/dL (ref 0.61–1.24)
GFR, Estimated: >60 mL/min (ref >60)
Glucose, Bld: 100 mg/dL — ABNORMAL HIGH (ref 70–99)
Potassium: 3.7 mmol/L (ref 3.5–5.1)
Sodium: 139 mmol/L (ref 135–145)

## 2024-07-16 LAB — CBC
HCT: 39.5 % (ref 39.0–52.0)
Hemoglobin: 12.8 g/dL — ABNORMAL LOW (ref 13.0–17.0)
MCH: 29.8 pg (ref 26.0–34.0)
MCHC: 32.4 g/dL (ref 30.0–36.0)
MCV: 92.1 fL (ref 80.0–100.0)
Platelets: 225 K/uL (ref 150–400)
RBC: 4.29 MIL/uL (ref 4.22–5.81)
RDW: 12.2 % (ref 11.5–15.5)
WBC: 9.9 K/uL (ref 4.0–10.5)
nRBC: 0 % (ref 0.0–0.2)

## 2024-07-16 LAB — RESP PANEL BY RT-PCR (RSV, FLU A&B, COVID)  RVPGX2
Influenza A by PCR: NEGATIVE
Influenza B by PCR: NEGATIVE
Resp Syncytial Virus by PCR: NEGATIVE
SARS Coronavirus 2 by RT PCR: NEGATIVE

## 2024-07-16 LAB — TROPONIN T, HIGH SENSITIVITY: Troponin T High Sensitivity: <15 ng/L (ref 0–19)

## 2024-07-16 LAB — GROUP A STREP BY PCR: Group A Strep by PCR: DETECTED — AB

## 2024-07-16 MED ORDER — AMOXICILLIN 500 MG PO CAPS: 500.0000 mg | ORAL_CAPSULE | Freq: Two times a day (BID) | ORAL | 0 refills | Status: AC

## 2024-07-16 MED ORDER — NAPROXEN 500 MG PO TABS: 500.0000 mg | ORAL_TABLET | Freq: Two times a day (BID) | ORAL | 0 refills | Status: DC

## 2024-07-16 MED ORDER — KETOROLAC TROMETHAMINE 15 MG/ML IJ SOLN
15.0000 mg | Freq: Once | INTRAMUSCULAR | Status: AC
  Administered 2024-07-16: 15 mg via INTRAMUSCULAR
  Filled 2024-07-16: qty 1

## 2024-07-16 MED ORDER — AMOXICILLIN 500 MG PO CAPS: 500.0000 mg | ORAL_CAPSULE | Freq: Two times a day (BID) | ORAL | 0 refills | Status: DC

## 2024-07-16 MED ORDER — DEXAMETHASONE SODIUM PHOSPHATE 10 MG/ML IJ SOLN
10.0000 mg | Freq: Once | INTRAMUSCULAR | Status: AC
Start: 2024-07-16 — End: 2024-07-16
  Administered 2024-07-16: 10 mg via INTRAMUSCULAR
  Filled 2024-07-16: qty 1

## 2024-07-16 MED ORDER — NAPROXEN 500 MG PO TABS
500.0000 mg | ORAL_TABLET | Freq: Two times a day (BID) | ORAL | 0 refills | Status: DC
Start: 2024-07-16 — End: 2024-07-16

## 2024-07-16 NOTE — ED Triage Notes (Signed)
 Reports throat pain, fatigue since this morning. Airway patent, voice clear  Mid chest pain and SHOB since Friday. States was having oral sex on friday, threw up and swallowed throw up multiple times, leading to chest pain. States mid chest pain and SHOB has not resolved since.

## 2024-07-16 NOTE — ED Notes (Signed)
 Pt. Is able to swallow and move head side to side with chin to chest and look up with no difficulty.

## 2024-07-16 NOTE — Discharge Instructions (Signed)
 It was a pleasure taking care of you today.  As discussed, your strep test was positive. I am sending you home with an antibiotics. Take as prescribed and finish all antibiotics. I have included the number of the ear nose and throat doctor.  Please call to schedule an appointment given your frequent strep throat.  All your other labs are reassuring. Return to the ER for new or worsening symptoms

## 2024-07-16 NOTE — ED Provider Notes (Signed)
 [facility] Provider Note   CSN: [id number] Arrival date & time: 07/16/24  9044     Patient presents with: Chest Pain and Sore Throat   Glen Howard is a 28 y.o. male with a past medical history significant for HIV who presents to the ED due to sore throat and fatigue that started this morning.  Admits to difficulty swallowing secondary to pain.  No shortness of breath, changes to phonation, or trismus.  Admits to getting strep throat frequently.  No fever or chills.  Patient also admits to some chest pain and shortness of breath x 5 days.  Patient states he was performing oral intercourse 5 days ago and swallowed his own vomit and has had chest pain ever since.  Admits to a sharp-like sensation in chest.  No history of blood clots.  Denies abdominal pain.  History obtained from patient and past medical records. No interpreter used during encounter.       Prior to Admission medications   Medication Sig Start Date End Date Taking? Authorizing Provider  amoxicillin  (AMOXIL ) 500 MG capsule Take 1 capsule (500 mg total) by mouth 2 (two) times daily for 10 days. 07/16/24 07/26/24 Yes Shelisa Fern, Aleck BROCKS, PA-C  naproxen  (NAPROSYN ) 500 MG tablet Take 1 tablet (500 mg total) by mouth 2 (two) times daily. 07/16/24  Yes Toba Claudio, Aleck BROCKS, PA-C  abacavir -dolutegravir -lamiVUDine  (TRIUMEQ ) 600-50-300 MG tablet Take 1 tablet by mouth daily. 12/22/21   Kuppelweiser, Cassie L, RPH-CPP  acetaminophen  (TYLENOL ) 500 MG tablet Take 1 tablet (500 mg total) by mouth every 6 (six) hours as needed for pain 12/29/21     amoxicillin  (AMOXIL ) 500 MG capsule Take 1 capsule (500 mg total) by mouth 3 (three) times daily with food until gone. 12/08/21     cabotegravir  & rilpivirine  ER (CABENUVA ) 600 & 900 MG/3ML injection Inject 6ml into the muscle once monthly for 2 months then every other month thereafter 12/26/21   Kuppelweiser, Cassie L, RPH-CPP  cabotegravir  & rilpivirine  ER  (CABENUVA ) 600 & 900 MG/3ML injection Inject 6mls IM Q month x 2 months and injection 6mls IM Q2 months thereafter 01/04/22     cabotegravir  & rilpivirine  ER (CABENUVA ) 600 & 900 MG/3ML injection Inject 6mls IM Q month x 2 months and injection 6mls IM Q2 months thereafter 01/04/22     chlorhexidine  (PERIDEX ) 0.12 % solution Floss and brush teeth. Completely rinse toothpaste from mouth. swish 15 mL (one capful) for 30 seconds, then expectorate 12/29/21     ibuprofen  (ADVIL ) 600 MG tablet Take 1 tablet (600 mg total) by mouth every 6 (six) hours as needed for pain 12/29/21     ibuprofen  (ADVIL ) 800 MG tablet Take 1 tablet (800 mg total) by mouth every 6 (six) hours as needed for pain 12/08/21       Allergies: Septra [sulfamethoxazole-trimethoprim]    Review of Systems  Constitutional:  Negative for chills and fever.  HENT:  Positive for sore throat and trouble swallowing. Negative for voice change.   Respiratory:  Positive for shortness of breath.   Cardiovascular:  Positive for chest pain.    Updated Vital Signs BP (!) 141/83   Pulse 74   Temp 98.5 F (36.9 C) (Oral)   Resp 18   Wt 63.5 kg   SpO2 98%   Physical Exam Vitals and nursing note reviewed.  Constitutional:      General: He is not in acute distress.    Appearance: He is not  ill-appearing.  HENT:     Head: Normocephalic.     Mouth/Throat:     Pharynx: Posterior oropharyngeal erythema present.     Comments: 2+ tonsillar hypertrophy bilaterally.  No exudates.  Uvula midline.  Tolerating oral secretions without difficulty.  Normal phonation. Eyes:     Pupils: Pupils are equal, round, and reactive to light.  Neck:     Comments: No meningismus. Cardiovascular:     Rate and Rhythm: Normal rate and regular rhythm.     Pulses: Normal pulses.     Heart sounds: Normal heart sounds. No murmur heard.    No friction rub. No gallop.  Pulmonary:     Effort: Pulmonary effort is normal.     Breath sounds: Normal breath sounds.  Chest:      Comments: Tenderness to palpation throughout anterior chest wall without crepitus or deformity. Abdominal:     General: Abdomen is flat. There is no distension.     Palpations: Abdomen is soft.     Tenderness: There is no abdominal tenderness. There is no guarding or rebound.  Musculoskeletal:        General: Normal range of motion.     Cervical back: Neck supple.  Skin:    General: Skin is warm and dry.  Neurological:     General: No focal deficit present.     Mental Status: He is alert.  Psychiatric:        Mood and Affect: Mood normal.        Behavior: Behavior normal.     (all labs ordered are listed, but only abnormal results are displayed) Labs Reviewed  GROUP A STREP BY PCR - Abnormal; Notable for the following components:      Result Value   Group A Strep by PCR DETECTED (*)    All other components within normal limits  BASIC METABOLIC PANEL WITH GFR - Abnormal; Notable for the following components:   Glucose, Bld 100 (*)    All other components within normal limits  CBC - Abnormal; Notable for the following components:   Hemoglobin 12.8 (*)    All other components within normal limits  RESP PANEL BY RT-PCR (RSV, FLU A&B, COVID)  [id number]  TROPONIN T, HIGH SENSITIVITY  TROPONIN T, HIGH SENSITIVITY    EKG: EKG Interpretation Date/Time:  Wednesday July 16 2024 10:09:30 EDT Ventricular Rate:  82 PR Interval:  126 QRS Duration:  84 QT Interval:  354 QTC Calculation: 414 R Axis:   77  Text Interpretation: Sinus rhythm ST elev, probable normal early repol pattern No old tracing to compare Confirmed by Freddi [facility] [phone]) on 07/16/2024 10:14:47 AM  Radiology: [facility] Chest 2 View Result Date: 07/16/2024 CLINICAL DATA:  Central chest pain, possible aspiration EXAM: CHEST - 2 VIEW COMPARISON:  None Available. FINDINGS: The heart size and mediastinal contours are within normal limits. Both lungs are clear. The visualized skeletal structures are unremarkable.  IMPRESSION: No active cardiopulmonary disease. Electronically Signed   By: Wilkie Lent M.D.   On: 07/16/2024 11:15     Procedures   Medications Ordered in the ED  dexamethasone  (DECADRON ) injection 10 mg (10 mg Intramuscular Given 07/16/24 1226)  ketorolac  (TORADOL ) 15 MG/ML injection 15 mg (15 mg Intramuscular Given 07/16/24 1222)                                    Medical Decision Making Amount and/or Complexity  of Data Reviewed External Data Reviewed: notes.    Details: ID note Labs: ordered. Decision-making details documented in ED Course. Radiology: ordered and independent interpretation performed. Decision-making details documented in ED Course. ECG/medicine tests: ordered and independent interpretation performed. Decision-making details documented in ED Course.  Risk Prescription drug management.   This patient presents to the ED for concern of sore throat/CP, this involves an extensive number of treatment options, and is a complaint that carries with it a high risk of complications and morbidity.  The differential diagnosis includes strep throat, viral infection, ACS, PE, dissection, etc   28 year old male presents to the ED due to sore throat and fatigue that started this morning.  Also admits to some chest pain and shortness of breath x 5 days after swallowing his own emesis after oral intercourse.  History of frequent strep throat.  History of HIV well-controlled per patient.  Upon arrival, vitals all within normal limits.  Patient in no acute distress.  On exam patient has 2+ tonsillar hypertrophy bilaterally.  Uvula midline.  No abscess.  Airway patent.  Tolerating oral secretions without difficulty.  No meningismus to suggest meningitis.  Lungs clear to auscultation bilaterally.  No stridor or wheeze.  Does have some anterior chest wall tenderness without crepitus or deformity.  Routine labs ordered.  Cardiac labs.  Strep test ordered.  IM Decadron  and Toradol   given.  CBC with no leukocytosis.  Mild anemia with hemoglobin 12.8.  BMP with normal renal function.  No major electrolyte derangements.  Troponin normal.  EKG normal sinus rhythm.  Likely early repolarization.  Low suspicion for ACS.  RVP negative.  Strep positive which is likely causing patient's sore throat.  No abscess visualized on exam.  Chest x-ray personally reviewed and interpreted which is negative for signs of pneumonia, pneumothorax, widened mediastinum.  Unclear what is causing patient's chest pain.  Possible MSK etiology given reproducible nature on exam.  Low suspicion for ACS.  Presentation not concerning for PE or aortic dissection.  Patient stable for discharge. Strict ED precautions discussed with patient. Patient states understanding and agrees to plan. Patient discharged home in no acute distress and stable vitals  Co morbidities that complicate the patient evaluation  HIV Cardiac Monitoring: / EKG:  The patient was maintained on a cardiac monitor.  I personally viewed and interpreted the cardiac monitored which showed an underlying rhythm of: NSR  Social Determinants of Health:  No PCP  Test / Admission - Considered:  Considered admission; however no abscess on exam, patient tolerating oral secretions without difficulty     Final diagnoses:  Strep pharyngitis  Nonspecific chest pain    ED Discharge Orders          Ordered    naproxen  (NAPROSYN ) 500 MG tablet  2 times daily        07/16/24 1226    amoxicillin  (AMOXIL ) 500 MG capsule  2 times daily        07/16/24 [address], [place] 07/16/24 1229    Freddi [facility], MD 07/17/24 1014

## 2024-10-14 ENCOUNTER — Emergency Department (HOSPITAL_BASED_OUTPATIENT_CLINIC_OR_DEPARTMENT_OTHER)
Admission: EM | Admit: 2024-10-14 | Discharge: 2024-10-14 | Disposition: A | Discharge status: Home / Self Care | Payer: Self-pay | Attending: Emergency Medicine | Admitting: Emergency Medicine

## 2024-10-14 ENCOUNTER — Encounter (HOSPITAL_BASED_OUTPATIENT_CLINIC_OR_DEPARTMENT_OTHER): Payer: Self-pay | Admitting: Emergency Medicine

## 2024-10-14 ENCOUNTER — Other Ambulatory Visit: Payer: Self-pay

## 2024-10-14 DIAGNOSIS — Z21 Asymptomatic human immunodeficiency virus [HIV] infection status: Secondary | ICD-10-CM | POA: Insufficient documentation

## 2024-10-14 DIAGNOSIS — U071 COVID-19: Secondary | ICD-10-CM | POA: Insufficient documentation

## 2024-10-14 LAB — MONONUCLEOSIS SCREEN: Mono Screen: NEGATIVE

## 2024-10-14 LAB — RESP PANEL BY RT-PCR (RSV, FLU A&B, COVID)  RVPGX2
Influenza A by PCR: NEGATIVE
Influenza B by PCR: NEGATIVE
Resp Syncytial Virus by PCR: NEGATIVE
SARS Coronavirus 2 by RT PCR: POSITIVE — AB

## 2024-10-14 LAB — GROUP A STREP BY PCR: Group A Strep by PCR: NOT DETECTED

## 2024-10-14 MED ORDER — LIDOCAINE VISCOUS HCL 2 % MT SOLN
15.0000 mL | Freq: Once | OROMUCOSAL | Status: AC
  Administered 2024-10-14: 15 mL via OROMUCOSAL
  Filled 2024-10-14: qty 15

## 2024-10-14 MED ORDER — KETOROLAC TROMETHAMINE 15 MG/ML IJ SOLN
15.0000 mg | Freq: Once | INTRAMUSCULAR | Status: AC
Start: 2024-10-14 — End: 2024-10-14
  Administered 2024-10-14: 15 mg via INTRAMUSCULAR
  Filled 2024-10-14: qty 1

## 2024-10-14 MED ORDER — IBUPROFEN 800 MG PO TABS: 800.0000 mg | ORAL_TABLET | Freq: Once | ORAL | Status: DC

## 2024-10-14 NOTE — Discharge Instructions (Addendum)
 It was a pleasure taking care of you today.  Based on your history, physical exam, and labs I feel you are safe for discharge.  Today you tested positive for COVID-19.  For your fever, body aches, and chills I recommend over-the-counter Tylenol  and/or ibuprofen .  Please do not exceed the max daily limit of 4000 mg of Tylenol  or more than 1000 mg in a single dose.  You may take up to 6 to 800 mg of ibuprofen  in a single dose.  For cough and congestion I recommend over-the-counter remedies as well, you may consider Zyrtec or Claritin.  You also could use Flonase for nasal congestion as well which is a nasal spray.  Please make your infectious disease doctor aware of your positive COVID test today.  If you experience any of the following symptoms including but not limited to refractory fever, trouble breathing, trouble swallowing, shortness of breath, chest pain, excessive weakness, dehydration, or other concerning symptom please return to the emergency department or seek further medical care.  COVID typically lasts 7 to 14 days, please limit contact with others while you still have a fever.  After your fever breaks I do recommend that you wear a mask the next 1 to 2 days to try and keep from spreading the virus.  If symptoms worsen recommend follow-up within 48 hours.

## 2024-10-14 NOTE — ED Triage Notes (Signed)
 Pt reports concern for strep throat- noticed discomfort 1st yesterday, worse today.  Noticed white spots in back of throat.    Denies known fever.

## 2024-10-14 NOTE — ED Notes (Signed)
 Reviewed discharge instructions, follow up and covid instructions with pt. Pts states understanding. Ambulatory at discharge with friend

## 2024-10-14 NOTE — ED Provider Notes (Signed)
 [place] [facility] Provider Note   CSN: [id number] Arrival date & time: 10/14/24  1235     Patient presents with: Sore Throat   Glen Howard is a 28 y.o. male who presents to the emergency department with a chief complaint of sore throat and white exudate on his tonsils.  Patient states that he began to have mild throat irritation yesterday however it is significantly worsened this morning.  Patient states that he looked in the mirror earlier and noticed he had white spots on his tonsils.  Patient is concerned about strep throat.  Patient is HIV positive on Cabenuva  and states that his levels are currently undetectable.  Also does have concern for possible gonorrhea/chlamydia infection.  Denies chest pain, shortness of breath, patient denies issues swallowing or eating.  Denies recent dental infection.  Past medical history significant for HIV.  Only prescription medication at home is Cabenuva .  Denies ear pain. Patient has not taken any OTC meds for symptoms yet.    Sore Throat       Prior to Admission medications   Medication Sig Start Date End Date Taking? Authorizing Provider  abacavir -dolutegravir -lamiVUDine  (TRIUMEQ ) 600-50-300 MG tablet Take 1 tablet by mouth daily. 12/22/21   Kuppelweiser, Cassie L, RPH-CPP  acetaminophen  (TYLENOL ) 500 MG tablet Take 1 tablet (500 mg total) by mouth every 6 (six) hours as needed for pain 12/29/21     amoxicillin  (AMOXIL ) 500 MG capsule Take 1 capsule (500 mg total) by mouth 3 (three) times daily with food until gone. 12/08/21     cabotegravir  & rilpivirine  ER (CABENUVA ) 600 & 900 MG/3ML injection Inject 6ml into the muscle once monthly for 2 months then every other month thereafter 12/26/21   Kuppelweiser, Cassie L, RPH-CPP  cabotegravir  & rilpivirine  ER (CABENUVA ) 600 & 900 MG/3ML injection Inject 6mls IM Q month x 2 months and injection 6mls IM Q2 months thereafter 01/04/22     cabotegravir  & rilpivirine  ER  (CABENUVA ) 600 & 900 MG/3ML injection Inject 6mls IM Q month x 2 months and injection 6mls IM Q2 months thereafter 01/04/22     chlorhexidine  (PERIDEX ) 0.12 % solution Floss and brush teeth. Completely rinse toothpaste from mouth. swish 15 mL (one capful) for 30 seconds, then expectorate 12/29/21     ibuprofen  (ADVIL ) 600 MG tablet Take 1 tablet (600 mg total) by mouth every 6 (six) hours as needed for pain 12/29/21     ibuprofen  (ADVIL ) 800 MG tablet Take 1 tablet (800 mg total) by mouth every 6 (six) hours as needed for pain 12/08/21     naproxen  (NAPROSYN ) 500 MG tablet Take 1 tablet (500 mg total) by mouth 2 (two) times daily. 07/16/24   Lorelle Aleck BROCKS, PA-C    Allergies: Septra [sulfamethoxazole-trimethoprim]    Review of Systems  HENT:  Positive for sore throat.     Updated Vital Signs BP (!) 120/97 (BP Location: Right Arm)   Pulse 92   Temp 100.1 F (37.8 C) (Oral)   Resp 18   Ht 5' 6 (1.676 m)   Wt 68.9 kg   SpO2 98%   BMI 24.53 kg/m   Physical Exam Vitals and nursing note reviewed.  Constitutional:      General: He is awake. He is not in acute distress.    Appearance: He is well-developed. He is not toxic-appearing or diaphoretic.     Comments: Patient visibly uncomfortable at time of initial exam, patient talking in full sentences on room  air, no tripoding, no drooling  HENT:     Head: Normocephalic and atraumatic.     Mouth/Throat:     Mouth: Mucous membranes are moist.     Tongue: Tongue does not deviate from midline.     Pharynx: Pharyngeal swelling and posterior oropharyngeal erythema present. No oropharyngeal exudate or uvula swelling.     Tonsils: No tonsillar exudate or tonsillar abscesses.     Comments: No uvular deviation, no peritonsillar abscess noted Cardiovascular:     Rate and Rhythm: Normal rate and regular rhythm.  Pulmonary:     Effort: Pulmonary effort is normal. No respiratory distress.     Breath sounds: No stridor. No wheezing, rhonchi or  rales.  Musculoskeletal:        General: Normal range of motion.     Cervical back: Normal range of motion.     Right lower leg: No edema.     Left lower leg: No edema.     Comments: Grossly normal ROM of all 4 extremities, patient ambulatory without assistance  Lymphadenopathy:     Cervical: Cervical adenopathy present.  Skin:    General: Skin is warm.     Capillary Refill: Capillary refill takes less than 2 seconds.  Neurological:     General: No focal deficit present.     Mental Status: He is alert and oriented to person, place, and time.  Psychiatric:        Mood and Affect: Mood normal.        Behavior: Behavior normal. Behavior is cooperative.     (all labs ordered are listed, but only abnormal results are displayed) Labs Reviewed  RESP PANEL BY RT-PCR (RSV, FLU A&B, COVID)  RVPGX2 - Abnormal; Notable for the following components:      Result Value   SARS Coronavirus 2 by RT PCR POSITIVE (*)    All other components within normal limits  GROUP A STREP BY PCR  MONONUCLEOSIS SCREEN  GC/CHLAMYDIA PROBE AMP ([place]) NOT AT [facility]    EKG: None  Radiology: No results found.   Procedures   Medications Ordered in the ED  lidocaine  (XYLOCAINE ) 2 % viscous mouth solution 15 mL (15 mLs Mouth/Throat Given 10/14/24 1526)  ketorolac  (TORADOL ) 15 MG/ML injection 15 mg (15 mg Intramuscular Given 10/14/24 1526)                                    Medical Decision Making Amount and/or Complexity of Data Reviewed Labs: ordered.  Risk Prescription drug management.   Patient presents to the ED for concern of sore throat, this involves an extensive number of treatment options, and is a complaint that carries with it a high risk of complications and morbidity.  The differential diagnosis includes strep, viral pharyngitis, gonorrhea/chlamydia infection, epiglottitis, Ludwig's angina, dental pain, mononucleosis, etc.   Co morbidities that complicate the patient  evaluation  HIV   Additional history obtained:  Previous medical history reviewed including recent visits for pharyngitis with [facility] as well as different urgent cares   Lab Tests:  I Ordered, and personally interpreted labs.  The pertinent results include: COVID-positive, flu, RSV, mono, strep negative, gonorrhea and chlamydia pharyngeal swab pending at this time, patient instructed to follow-up with this result on MyChart   Medicines ordered and prescription drug management:  I ordered medication including Toradol , viscous lidocaine  for throat pain, fever, body aches Reevaluation of the patient  after these medicines showed that the patient improved I have reviewed the patients home medicines and have made adjustments as needed   Test Considered:  None   Critical Interventions:  None   Problem List / ED Course:  28 year old male, vital signs stable however temp of 100.1 here in the emergency department, presents for possible pharyngitis, patient concerned about strep throat, patient is HIV positive but levels are undetectable per the patient as he is compliant with his Cabenuva  treatment On physical exam patient does appear in discomfort, has not had any symptomatic treatment today, very large tonsils present with no uvular deviation, no sign of peritonsillar abscess, no real exudate noted in posterior oropharynx, patient talking in full sentences on room air, no respiratory distress, no stridor Will obtain testing for COVID, flu, RSV, strep, as well as gonorrhea and chlamydial swabs of the pharynx due to possible exposure, will also test for mono Will symptomatically treat with Toradol  and viscous lidocaine  in the meantime On reassessment patient symptomatically improved Educated patient that he tested positive for COVID Discussed symptomatic treatment at home as well as return precautions to work, patient is not a good candidate for Paxlovid medication as there is a  interaction with his HIV treatment, instructed patient to make his infectious disease doctor aware of his visit and findings today, at this time will recommend over-the-counter symptomatic treatment Return precautions given Patient discharged Most likely diagnosis at this time is COVID-19 infection leading to systemic symptoms including fever and pharyngitis, clinically I see no indication for admission for this patient at this time   Reevaluation:  After the interventions noted above, I reevaluated the patient and found that they have :improved   Social Determinants of Health:  No PCP   Dispostion:  After consideration of the diagnostic results and the patients response to treatment, I feel that the patient would benefit from discharge and outpatient therapy as described.     Final diagnoses:  COVID-19    ED Discharge Orders     None          Kaedon Fanelli F, PA-C 10/14/24 1826    Bernard Drivers, MD 10/15/24 [phone]

## 2024-10-15 LAB — GC/CHLAMYDIA PROBE AMP (~~LOC~~) NOT AT ARMC
Chlamydia: NEGATIVE
Comment: NEGATIVE
Comment: NORMAL
Neisseria Gonorrhea: NEGATIVE

## 2024-11-06 ENCOUNTER — Emergency Department (HOSPITAL_BASED_OUTPATIENT_CLINIC_OR_DEPARTMENT_OTHER)
Admission: EM | Admit: 2024-11-06 | Discharge: 2024-11-06 | Disposition: A | Discharge status: Home / Self Care | Payer: Self-pay | Attending: Emergency Medicine | Admitting: Emergency Medicine

## 2024-11-06 ENCOUNTER — Other Ambulatory Visit: Payer: Self-pay

## 2024-11-06 DIAGNOSIS — Z21 Asymptomatic human immunodeficiency virus [HIV] infection status: Secondary | ICD-10-CM | POA: Insufficient documentation

## 2024-11-06 DIAGNOSIS — J029 Acute pharyngitis, unspecified: Secondary | ICD-10-CM | POA: Insufficient documentation

## 2024-11-06 LAB — RESP PANEL BY RT-PCR (RSV, FLU A&B, COVID)  RVPGX2
Influenza A by PCR: NEGATIVE
Influenza B by PCR: NEGATIVE
Resp Syncytial Virus by PCR: NEGATIVE
SARS Coronavirus 2 by RT PCR: NEGATIVE

## 2024-11-06 LAB — GROUP A STREP BY PCR: Group A Strep by PCR: NOT DETECTED

## 2024-11-06 MED ORDER — IBUPROFEN 400 MG PO TABS
600.0000 mg | ORAL_TABLET | Freq: Once | ORAL | Status: AC
  Administered 2024-11-06: 10:00:00 600 mg via ORAL
  Filled 2024-11-06: qty 1

## 2024-11-06 NOTE — ED Triage Notes (Signed)
 Pt  states that he has had some pain with his tonsils. States that in the past he has had tonsiliits and covid that affected his tonsils. Pt states that he has some white spots in the back of his throat

## 2024-11-06 NOTE — ED Provider Notes (Signed)
 [place] [facility] Provider Note   CSN: [id number] Arrival date & time: 11/06/24  9096     Patient presents with: Sore Throat   Glen Howard is a 28 y.o. male.   Pt is a 28 yo male with pmhx significant for HIV.  He presents to the ED today with a sore throat.  He has spots on the his right tonsil.  He is able to swallow.  No f/c.  He reports good control of his HIV.  He has been compliant with his meds.  His last injection of Cabenuva  was in November.       Prior to Admission medications  Medication Sig Start Date End Date Taking? Authorizing Provider  cabotegravir  & rilpivirine  ER (CABENUVA ) 600 & 900 MG/3ML injection Inject 6ml into the muscle once monthly for 2 months then every other month thereafter 12/26/21   Kuppelweiser, Cassie L, RPH-CPP  cabotegravir  & rilpivirine  ER (CABENUVA ) 600 & 900 MG/3ML injection Inject 6mls IM Q month x 2 months and injection 6mls IM Q2 months thereafter 01/04/22     cabotegravir  & rilpivirine  ER (CABENUVA ) 600 & 900 MG/3ML injection Inject 6mls IM Q month x 2 months and injection 6mls IM Q2 months thereafter 01/04/22       Allergies: Septra [sulfamethoxazole-trimethoprim]    Review of Systems  HENT:  Positive for sore throat.   All other systems reviewed and are negative.   Updated Vital Signs BP (!) 123/98   Temp 98.9 F (37.2 C) (Oral)   Resp 18   Ht 5' 6 (1.676 m)   Wt 67.1 kg   SpO2 100%   BMI 23.89 kg/m   Physical Exam Vitals and nursing note reviewed.  Constitutional:      Appearance: He is well-developed.  HENT:     Head: Normocephalic and atraumatic.     Right Ear: Ear canal normal.     Left Ear: Ear canal normal.     Mouth/Throat:     Mouth: Mucous membranes are dry.     Pharynx: Oropharynx is clear.     Tonsils: Tonsillar exudate present.  Eyes:     Conjunctiva/sclera: Conjunctivae normal.     Pupils: Pupils are equal, round, and reactive to light.  Cardiovascular:      Rate and Rhythm: Normal rate and regular rhythm.     Heart sounds: Normal heart sounds.  Pulmonary:     Effort: Pulmonary effort is normal.     Breath sounds: Normal breath sounds.  Abdominal:     General: Bowel sounds are normal.     Palpations: Abdomen is soft.  Musculoskeletal:     Cervical back: Normal range of motion and neck supple.  Skin:    General: Skin is warm.     Capillary Refill: Capillary refill takes less than 2 seconds.  Neurological:     General: No focal deficit present.     Mental Status: He is alert and oriented to person, place, and time.  Psychiatric:        Mood and Affect: Mood normal.        Behavior: Behavior normal.     (all labs ordered are listed, but only abnormal results are displayed) Labs Reviewed  RESP PANEL BY RT-PCR (RSV, FLU A&B, COVID)  RVPGX2  GROUP A STREP BY PCR  GC/CHLAMYDIA PROBE AMP ([place]) NOT AT [facility]    EKG: None  Radiology: No results found.   Procedures   Medications Ordered in  the ED  ibuprofen  (ADVIL ) tablet 600 mg (600 mg Oral Given 11/06/24 0948)                                    Medical Decision Making  This patient presents to the ED for concern of sore throat, this involves an extensive number of treatment options, and is a complaint that carries with it a high risk of complications and morbidity.  The differential diagnosis includes covid/flu/rsv, strep, gc/chl   Co morbidities that complicate the patient evaluation  HIV   Additional history obtained:  Additional history obtained from epic chart review    Lab Tests:  I Ordered, and personally interpreted labs.  The pertinent results include:  strep neg, covid/flu/rsv neg; chl/gc pending   Medicines ordered and prescription drug management:  I ordered medication including ibuprofen  for sx  Reevaluation of the patient after these medicines showed that the patient improved I have reviewed the patients home medicines and have made  adjustments as needed  Problem List / ED Course:  Pharyngitis:  likely viral.  Pt is stable for d/c.  Return if worse.  F/u with pcp.   Reevaluation:  After the interventions noted above, I reevaluated the patient and found that they have :improved   Social Determinants of Health:  Lives at home   Dispostion:  After consideration of the diagnostic results and the patients response to treatment, I feel that the patent would benefit from discharge with outpatient f/u.       Final diagnoses:  Viral pharyngitis    ED Discharge Orders     None          Dean Clarity, MD 11/06/24 1016

## 2024-11-06 NOTE — ED Notes (Signed)
 ED Provider at bedside.

## 2024-11-07 LAB — GC/CHLAMYDIA PROBE AMP (~~LOC~~) NOT AT ARMC
Chlamydia: NEGATIVE
Comment: NEGATIVE
Comment: NORMAL
Neisseria Gonorrhea: NEGATIVE
